# Patient Record
Sex: Female | Born: 1995 | Race: Black or African American | Hispanic: No | Marital: Single | Smoking: Never smoker
Health system: Southern US, Community
[De-identification: ages and names within clinical notes are randomized; demographics above are authoritative.]

## PROBLEM LIST (undated history)

## (undated) DIAGNOSIS — M419 Scoliosis, unspecified: Secondary | ICD-10-CM

---
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
## 2017-11-23 IMAGING — CT CT RENAL STONE PROTOCOL
2 of 4 series · 11 of 46 positions shown, 12 images · non-contrast
Comparison: None.

CLINICAL DATA: Bilateral flank pain with nausea and vomiting since
this morning.

EXAM:
CT ABDOMEN AND PELVIS WITHOUT CONTRAST
TECHNIQUE: Multidetector CT imaging of the abdomen and pelvis was performed
following the standard protocol without IV contrast.

[Series 201: stone study, idose (2) · axial · 0.77mm/px · z∈[-302,+78]mm · 8 of 92 slices shown, 9 images]
[im 8/92  soft-tissue]
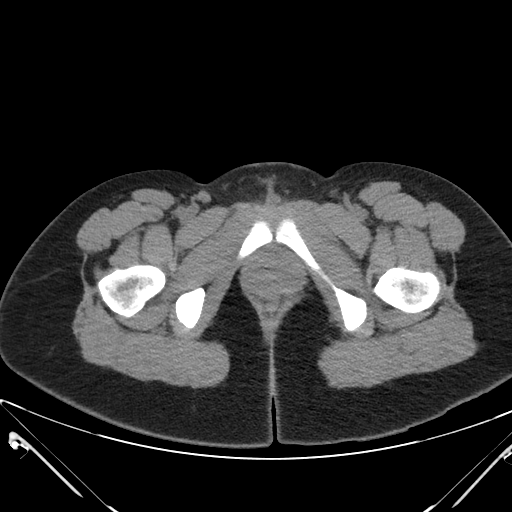
[im 8/92  bone]
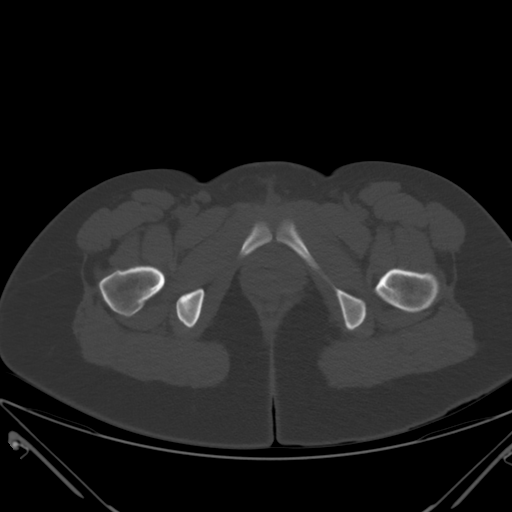
[im 19/92  soft-tissue]
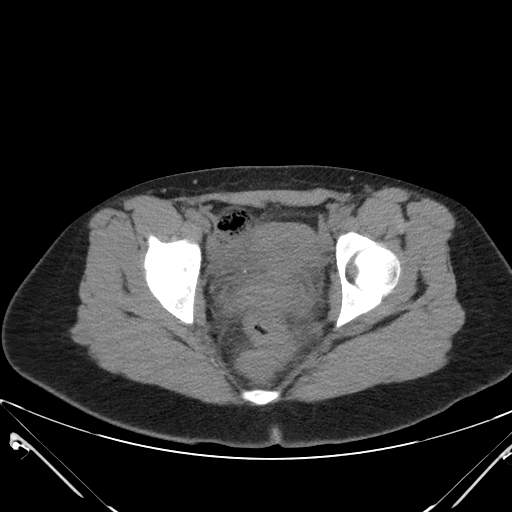
[im 30/92  soft-tissue]
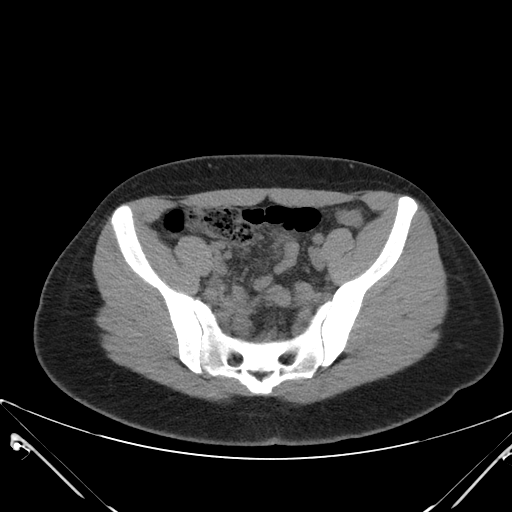
[im 41/92  soft-tissue]
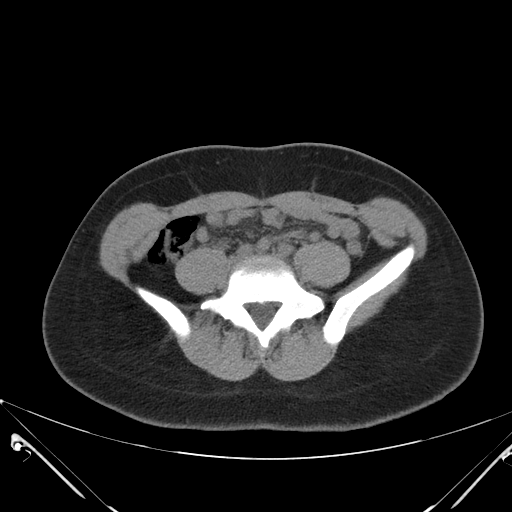
[im 51/92  soft-tissue]
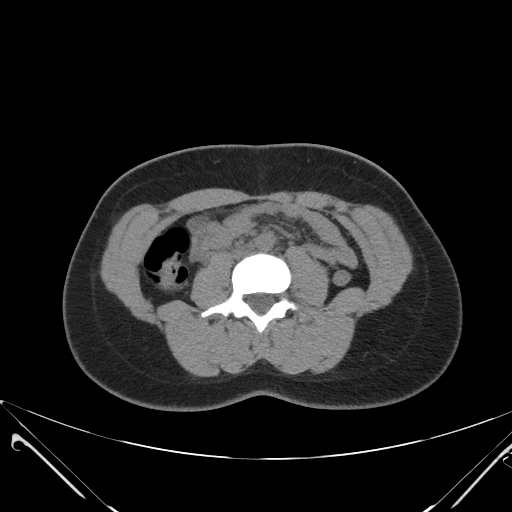
[im 62/92  soft-tissue]
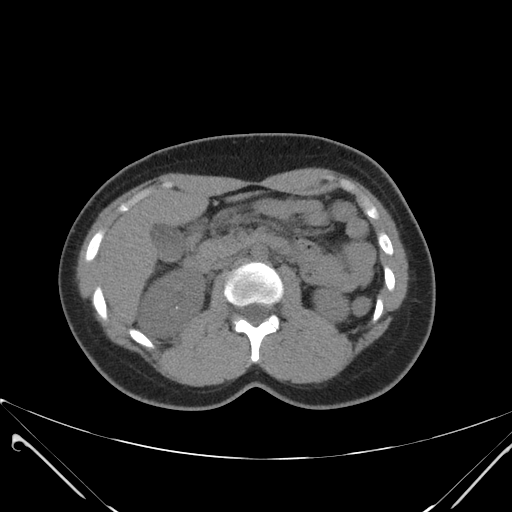
[im 73/92  soft-tissue]
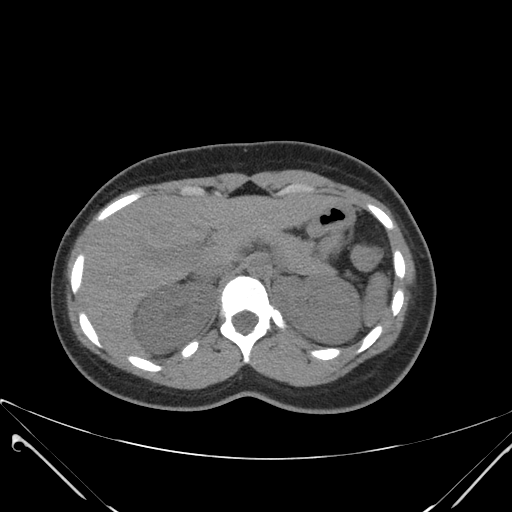
[im 84/92  soft-tissue]
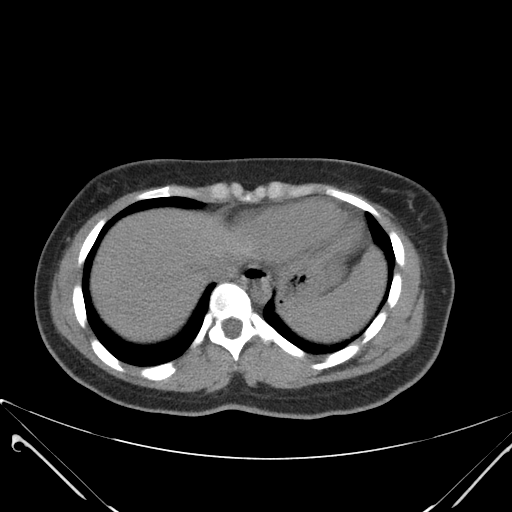

[Series 203: coronals, idose (2) · coronal · 0.45mm/px · 3 of 118 slices shown]
[im 40/118  soft-tissue]
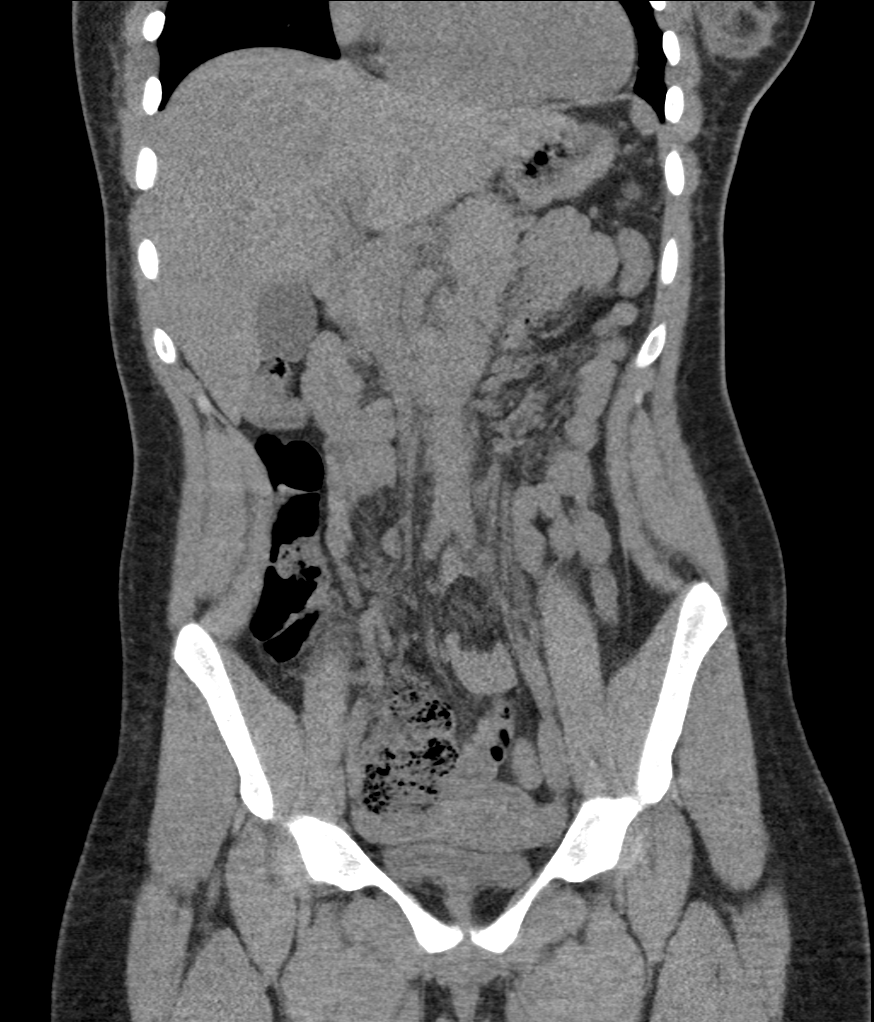
[im 53/118  soft-tissue]
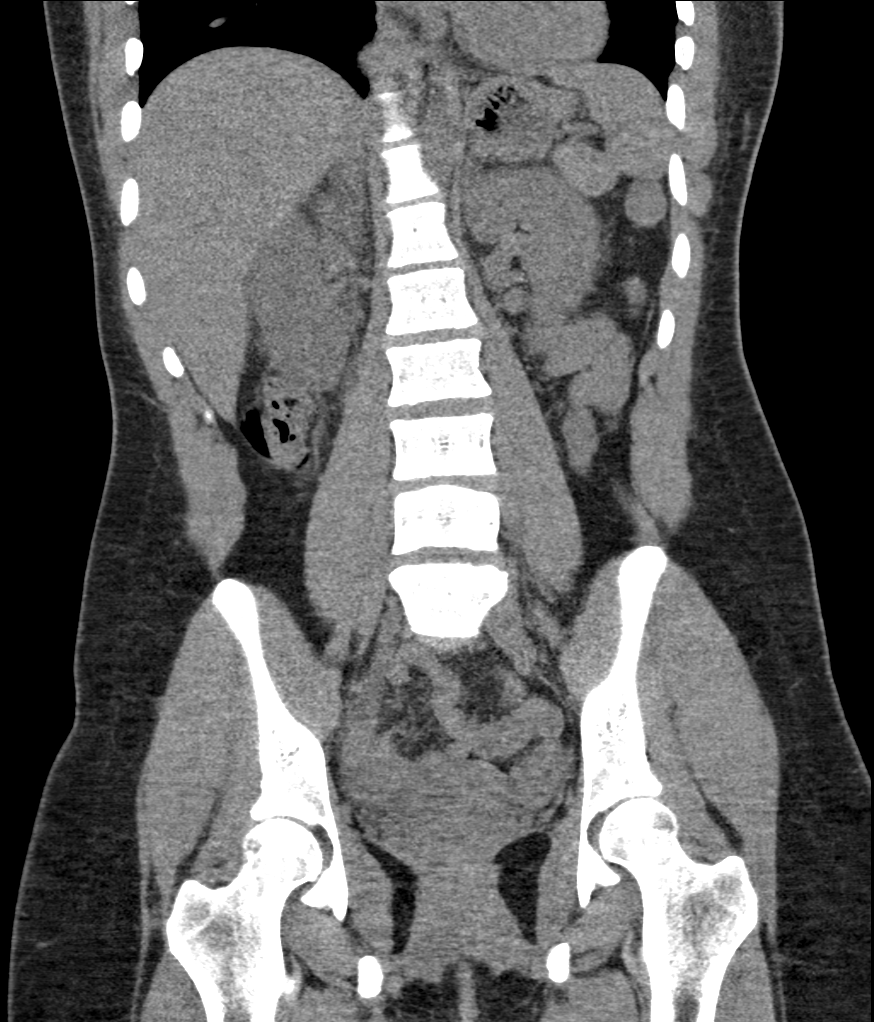
[im 66/118  soft-tissue]
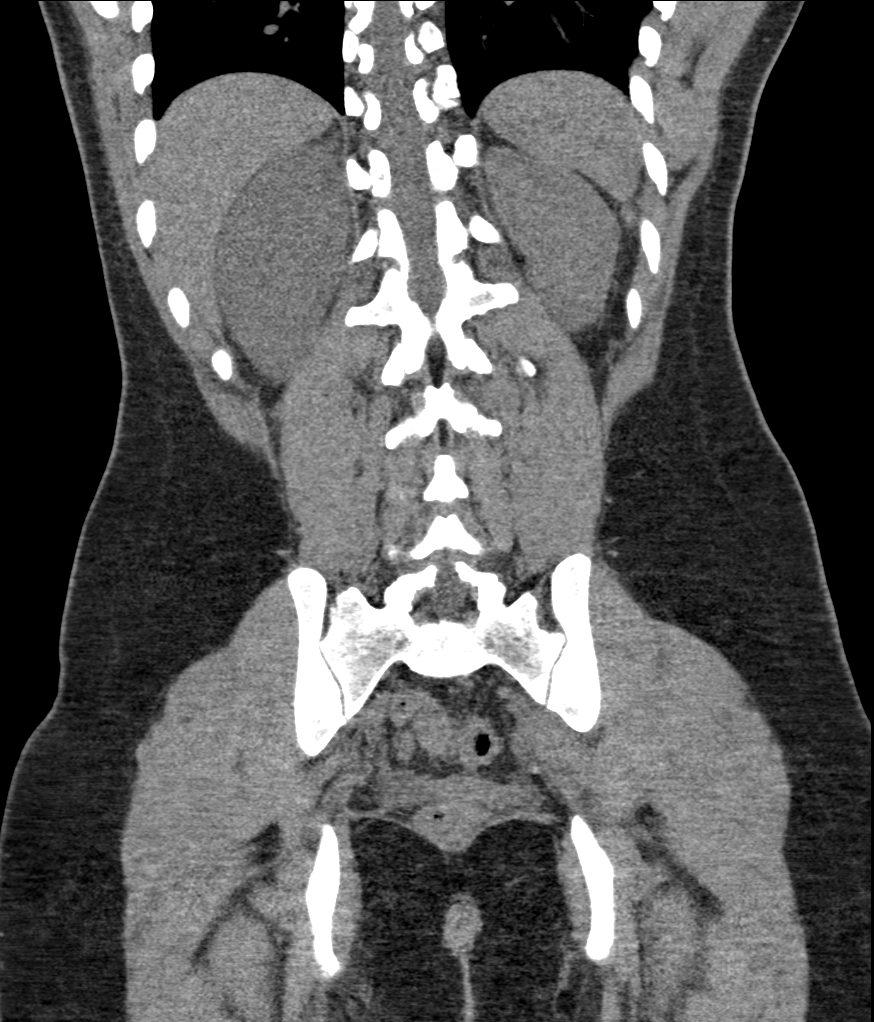

[11 of 46 positions shown; findings below may reference images not displayed]

FINDINGS: Lower chest: Small focal area of infiltration in the left lung base
could represent atelectasis or pneumonia.

Hepatobiliary: Suggestion of vague layering density in the dependent
gallbladder could represent small stones. No bile duct dilatation.
Unenhanced appearance of the liver is unremarkable.

Pancreas: Unenhanced appearance is unremarkable.

Spleen: Unenhanced appearance is unremarkable.

Adrenals/Urinary Tract: No adrenal gland nodules. Tiny punctate
intrarenal stones on the right. Punctate size 2 mm stone in the
distal right ureter at the ureterovesical junction. Right
hydronephrosis and hydroureter with stranding around the right
kidney. Left kidney and ureter are decompressed. Bladder is
decompressed.

Stomach/Bowel: Stomach, small bowel, and colon are decompressed.
Small amount of stool in the right colon. Appendix is normal.

Vascular/Lymphatic: No significant vascular findings are present. No
enlarged abdominal or pelvic lymph nodes.

Reproductive: Uterus and bilateral adnexa are unremarkable. Small
amount of free fluid in the pelvis is likely physiologic.

Other: Minimal umbilical hernia containing fat. No free air in the
abdomen.

Musculoskeletal: No acute or significant osseous findings.
IMPRESSION: 2 mm stone in the distal right ureter at the ureterovesical junction
with moderate proximal obstruction. Additional punctate size
nonobstructing intrarenal stones on the right. Small amount of free
fluid in the pelvis is likely physiologic.

## 2018-04-17 ENCOUNTER — Emergency Department (HOSPITAL_COMMUNITY)
Admission: EM | Admit: 2018-04-17 | Discharge: 2018-04-17 | Disposition: A | Discharge status: Home / Self Care | Payer: Self-pay | Attending: Emergency Medicine | Admitting: Emergency Medicine

## 2018-04-17 ENCOUNTER — Other Ambulatory Visit: Payer: Self-pay

## 2018-04-17 ENCOUNTER — Encounter (HOSPITAL_COMMUNITY): Payer: Self-pay

## 2018-04-17 DIAGNOSIS — L02215 Cutaneous abscess of perineum: Secondary | ICD-10-CM | POA: Insufficient documentation

## 2018-04-17 MED ORDER — LIDOCAINE-EPINEPHRINE (PF) 2 %-1:200000 IJ SOLN
20.0000 mL | Freq: Once | INTRAMUSCULAR | Status: AC
  Administered 2018-04-17: 20 mL
  Filled 2018-04-17: qty 20

## 2018-04-17 MED ORDER — NAPROXEN 375 MG PO TABS: 375.0000 mg | ORAL_TABLET | Freq: Two times a day (BID) | ORAL | 0 refills | Status: DC

## 2018-04-17 MED ORDER — TRAMADOL HCL 50 MG PO TABS: 50.0000 mg | ORAL_TABLET | Freq: Four times a day (QID) | ORAL | 0 refills | Status: DC | PRN

## 2018-04-17 NOTE — Discharge Instructions (Signed)
Contact a health care provider if: °You have more redness, swelling, or pain around your abscess. °You have more fluid or blood coming from your abscess. °Your abscess feels warm to the touch. °You have more pus or a bad smell coming from your abscess. °You have a fever. °You have muscle aches. °You have chills or a general ill feeling. °Get help right away if: °You have severe pain. °You see red streaks on your skin spreading away from the abscess. °

## 2018-04-17 NOTE — ED Notes (Signed)
Abigail PA at bedside   

## 2018-04-17 NOTE — ED Triage Notes (Signed)
He tells me she has an abscess on her labia. She states she has had it for about three days. She also tells me that she has had an abscess "in this exact spot once before".

## 2018-04-17 NOTE — ED Provider Notes (Signed)
[facility]-EMERGENCY DEPT Provider Note   CSN: [id number] Arrival date & time: 04/17/18  1020     History   Chief Complaint Chief Complaint  Patient presents with  . Abscess    HPI Cindy Bush is a 22 y.o. female who presents with abscess of the perineum.  Patient states that she has had this in the past and had to have it drained about a year or 2 ago.  She has had 3 days of worsening pain which became severe last night and had difficulty sleeping.  She denies fevers, chills.  She rates her pain at 10 out of 10.  Is worse with palpation.  She has been using warm compresses without relief of her symptoms.  HPI  Past Medical History:  Diagnosis Date  . Scoliosis     There are no active problems to display for this patient.   No past surgical history on file.   OB History   None      Home Medications    Prior to Admission medications   Medication Sig Start Date End Date Taking? Authorizing Provider  CRANBERRY PO Take 1 tablet by mouth daily.   Yes [provider]  Ibuprofen (MIDOL) 200 MG CAPS Take 200 mg by mouth daily as needed (cramping).   Yes [provider]  Probiotic Product (PROBIOTIC ADVANCED PO) Take 1 tablet by mouth daily.   Yes [provider]  ondansetron (ZOFRAN) 4 MG tablet Take 1 tablet (4 mg total) by mouth every 6 (six) hours. Patient not taking: Reported on 04/17/2018 03/06/16   Santiago Glad, PA-C  oxyCODONE-acetaminophen (PERCOCET/ROXICET) 5-325 MG tablet Take 1-2 tablets by mouth every 6 (six) hours as needed for severe pain. Patient not taking: Reported on 04/17/2018 03/06/16   Santiago Glad, PA-C  tamsulosin (FLOMAX) 0.4 MG CAPS capsule Take 1 capsule (0.4 mg total) by mouth daily. Patient not taking: Reported on 04/17/2018 03/06/16   Santiago Glad, PA-C    Family History No family history on file.  Social History Social History   Tobacco Use  . Smoking status: Never Smoker  .  Smokeless tobacco: Never Used  Substance Use Topics  . Alcohol use: No  . Drug use: No     Allergies   Patient has no known allergies.   Review of Systems Review of Systems Ten systems reviewed and are negative for acute change, except as noted in the HPI.    Physical Exam Updated Vital Signs BP 117/87 (BP Location: Right Arm)   Pulse (!) 101   Temp 98.4 F (36.9 C) (Oral)   Resp 16   Ht 5\' 6"  (1.676 m)   Wt 65.8 kg   LMP 04/01/2018 (Approximate)   SpO2 100%   BMI 23.40 kg/m   Physical Exam  Constitutional: She is oriented to person, place, and time. She appears well-developed and well-nourished. No distress.  HENT:  Head: Normocephalic and atraumatic.  Eyes: Conjunctivae are normal. No scleral icterus.  Neck: Normal range of motion.  Cardiovascular: Normal rate, regular rhythm and normal heart sounds. Exam reveals no gallop and no friction rub.  No murmur heard. Pulmonary/Chest: Effort normal and breath sounds normal. No respiratory distress.  Abdominal: Soft. Bowel sounds are normal. She exhibits no distension and no mass. There is no tenderness. There is no guarding.  Genitourinary:     Neurological: She is alert and oriented to person, place, and time.  Skin: Skin is warm and dry. She is not diaphoretic.  Psychiatric: Her behavior is normal.  Nursing note and vitals reviewed.    ED Treatments / Results  Labs (all labs ordered are listed, but only abnormal results are displayed) Labs Reviewed - No data to display  EKG None  Radiology No results found.  Procedures .Marland Kitchen.Incision and Drainage Date/Time: 04/17/2018 4:58 PM Performed by: Arthor CaptainHarris, Ustin Cruickshank, PA-C Authorized by: Arthor CaptainHarris, Endia Moncur, PA-C   Consent:    Consent obtained:  Verbal   Consent given by:  Patient   Risks discussed:  Bleeding, incomplete drainage and pain   Alternatives discussed:  No treatment Location:    Type:  Abscess   Size:  6 cm   Location:  Anogenital   Anogenital  location:  Perineum Pre-procedure details:    Skin preparation:  Betadine Procedure type:    Complexity:  Simple Procedure details:    Incision types:  Stab incision and single straight   Incision depth:  Dermal   Scalpel blade:  11   Wound management:  Probed and deloculated, irrigated with saline and extensive cleaning   Drainage:  Purulent   Drainage amount:  Moderate   Wound treatment:  Wound left open   Packing materials:  1/4 in gauze Post-procedure details:    Patient tolerance of procedure:  Tolerated well, no immediate complications   (including critical care time)  Medications Ordered in ED Medications  lidocaine-EPINEPHrine (XYLOCAINE W/EPI) 2 %-1:200000 (PF) injection 20 mL (20 mLs Infiltration Given by Other 04/17/18 1225)     Initial Impression / Assessment and Plan / ED Course  I have reviewed the triage vital signs and the nursing notes.  Pertinent labs & imaging results that were available during my care of the patient were reviewed by me and considered in my medical decision making (see chart for details).     Patient with skin abscess amenable to incision and drainage.  Packing placed.  No evidence of surrounding cellulitis.  Wound recheck in 2 days..  Will d/c to home.  No antibiotic therapy is indicated.   Final Clinical Impressions(s) / ED Diagnoses   Final diagnoses:  Perineal abscess    ED Discharge Orders    None       Arthor CaptainHarris, Cimberly Stoffel, PA-C 04/17/18 1700    Shaune PollackIsaacs, [place], MD 04/19/18 [phone]

## 2018-07-11 ENCOUNTER — Encounter (HOSPITAL_COMMUNITY): Payer: Self-pay

## 2018-07-11 ENCOUNTER — Emergency Department (HOSPITAL_COMMUNITY)
Admission: EM | Admit: 2018-07-11 | Discharge: 2018-07-11 | Disposition: A | Discharge status: Home / Self Care | Payer: PRIVATE HEALTH INSURANCE | Attending: Emergency Medicine | Admitting: Emergency Medicine

## 2018-07-11 DIAGNOSIS — Y999 Unspecified external cause status: Secondary | ICD-10-CM | POA: Insufficient documentation

## 2018-07-11 DIAGNOSIS — Y9389 Activity, other specified: Secondary | ICD-10-CM | POA: Diagnosis not present

## 2018-07-11 DIAGNOSIS — S39012A Strain of muscle, fascia and tendon of lower back, initial encounter: Secondary | ICD-10-CM | POA: Diagnosis not present

## 2018-07-11 DIAGNOSIS — S199XXA Unspecified injury of neck, initial encounter: Secondary | ICD-10-CM | POA: Diagnosis present

## 2018-07-11 DIAGNOSIS — S161XXA Strain of muscle, fascia and tendon at neck level, initial encounter: Secondary | ICD-10-CM | POA: Diagnosis not present

## 2018-07-11 DIAGNOSIS — Y92410 Unspecified street and highway as the place of occurrence of the external cause: Secondary | ICD-10-CM | POA: Diagnosis not present

## 2018-07-11 MED ORDER — CYCLOBENZAPRINE HCL 10 MG PO TABS: 10.0000 mg | ORAL_TABLET | Freq: Two times a day (BID) | ORAL | 0 refills | Status: AC | PRN

## 2018-07-11 MED ORDER — NAPROXEN 375 MG PO TABS: 375.0000 mg | ORAL_TABLET | Freq: Two times a day (BID) | ORAL | 0 refills | Status: AC
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
## 2018-07-11 NOTE — Discharge Instructions (Addendum)
Apply warm compresses to sore muscles for 20 minutes at a time. Gentler range of motion exercises as discussed. Flexeril as needed as prescribed for muscle tightness, do not drive if taking this medicine. Naproxen as prescribed as needed for pain. Follow up with primary care provider.
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
